# Patient Record
Sex: Female | Born: 2005 | Race: Black or African American | Hispanic: No | Marital: Single | Smoking: Never smoker
Health system: Southern US, Community
[De-identification: ages and names within clinical notes are randomized; demographics above are authoritative.]

## PROBLEM LIST (undated history)

## (undated) ENCOUNTER — Emergency Department (HOSPITAL_BASED_OUTPATIENT_CLINIC_OR_DEPARTMENT_OTHER): Admission: EM | Discharge status: Absent without leave | Payer: Self-pay

---
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
## 2023-05-21 ENCOUNTER — Ambulatory Visit: Payer: BC Managed Care – PPO

## 2023-05-21 ENCOUNTER — Other Ambulatory Visit (HOSPITAL_COMMUNITY)
Admission: RE | Admit: 2023-05-21 | Discharge: 2023-05-21 | Disposition: A | Discharge status: Home / Self Care | Payer: BC Managed Care – PPO | Source: Ambulatory Visit | Attending: Obstetrics and Gynecology | Admitting: Obstetrics and Gynecology

## 2023-05-21 VITALS — BP 107/68 | HR 61 | Wt 171.4 lb

## 2023-05-21 DIAGNOSIS — Z113 Encounter for screening for infections with a predominantly sexual mode of transmission: Secondary | ICD-10-CM | POA: Diagnosis not present

## 2023-05-21 DIAGNOSIS — N898 Other specified noninflammatory disorders of vagina: Secondary | ICD-10-CM | POA: Insufficient documentation

## 2023-05-21 DIAGNOSIS — R8290 Unspecified abnormal findings in urine: Secondary | ICD-10-CM | POA: Diagnosis not present

## 2023-05-21 DIAGNOSIS — B3731 Acute candidiasis of vulva and vagina: Secondary | ICD-10-CM | POA: Diagnosis not present

## 2023-05-21 LAB — POCT URINALYSIS DIPSTICK
Bilirubin, UA: NEGATIVE
Blood, UA: NEGATIVE
Glucose, UA: NEGATIVE
Ketones, UA: NEGATIVE
Nitrite, UA: NEGATIVE
Protein, UA: NEGATIVE
Spec Grav, UA: 1.025 (ref 1.010–1.025)
Urobilinogen, UA: 0.2 U/dL
pH, UA: 5 (ref 5.0–8.0)

## 2023-05-21 NOTE — Progress Notes (Signed)
..  SUBJECTIVE:  17 y.o. female complains of white vaginal discharge and burning for 5 day(s). Denies abnormal vaginal bleeding or significant pelvic pain or fever. Reports cloudy urine. Denies history of known exposure to STD.  LMP 04/25/23  OBJECTIVE:  She appears well, afebrile. Urine dipstick: positive for leukocytes.  ASSESSMENT:  Vaginal Discharge/burning  Cloudy urine   PLAN:  GC, chlamydia, trichomonas, BVAG, CVAG probe and urine culture sent to lab. Treatment: To be determined once lab results are received ROV prn if symptoms persist or worsen.

## 2023-05-22 LAB — CERVICOVAGINAL ANCILLARY ONLY
Bacterial Vaginitis (gardnerella): NEGATIVE
Candida Glabrata: NEGATIVE
Candida Vaginitis: POSITIVE — AB
Chlamydia: NEGATIVE
Comment: NEGATIVE
Comment: NEGATIVE
Comment: NEGATIVE
Comment: NEGATIVE
Comment: NEGATIVE
Comment: NORMAL
Neisseria Gonorrhea: NEGATIVE
Trichomonas: NEGATIVE

## 2023-05-23 LAB — URINE CULTURE

## 2023-05-24 MED ORDER — FLUCONAZOLE 150 MG PO TABS: 150.0000 mg | ORAL_TABLET | ORAL | 0 refills | Status: DC

## 2023-05-24 NOTE — Addendum Note (Signed)
Addended by: Harvie Bridge on: 05/24/2023 01:10 PM   Modules accepted: Orders

## 2023-09-29 ENCOUNTER — Ambulatory Visit: Payer: 59 | Admitting: Obstetrics & Gynecology

## 2023-10-08 ENCOUNTER — Encounter: Payer: Self-pay | Admitting: Obstetrics and Gynecology

## 2023-10-08 ENCOUNTER — Ambulatory Visit: Payer: 59 | Admitting: Obstetrics and Gynecology

## 2023-10-08 VITALS — BP 121/77 | HR 74 | Ht 61.0 in | Wt 178.0 lb

## 2023-10-08 DIAGNOSIS — Z975 Presence of (intrauterine) contraceptive device: Secondary | ICD-10-CM | POA: Diagnosis not present

## 2023-10-08 DIAGNOSIS — Z30431 Encounter for routine checking of intrauterine contraceptive device: Secondary | ICD-10-CM

## 2023-10-08 NOTE — Progress Notes (Signed)
   GYNECOLOGY PROGRESS NOTE  History:  18 y.o. G0P0000 presents to [facility] for IUD check. Paragard placed 09/2022. Doing well with IUD in place. Had occurrence where she was using the bathroom and straining and saw the strings. She has had some spotting after cycle finishes. Symptoms have not continued.   The following portions of the patient's history were reviewed and updated as appropriate: allergies, current medications, past family history, past medical history, past social history, past surgical history and problem list. Last pap smear on < 21 y.o.  Health Maintenance Due  Topic Date Due   Pneumococcal Vaccine 19-82 Years old (1 of 1 - PPSV23 or PCV20) 02/08/2010   HIV Screening  Never done   Hepatitis C Screening  Never done   INFLUENZA VACCINE  03/05/2023   COVID-19 Vaccine (3 - 2024-25 season) 04/05/2023     Review of Systems:  Pertinent items are noted in HPI.   Objective:  Physical Exam There were no vitals taken for this visit. VS reviewed, nursing note reviewed,  Constitutional: well developed, well nourished, no distress Pulm/chest wall: normal effort Breast Exam: deferred Abdomen: soft Neuro: alert and oriented  Skin: warm, dry Psych: affect normal Pelvic exam: Cervix pink, visually closed, without lesion, scant white creamy discharge, vaginal walls and external genitalia normal IUD strings in place, 2cm     Assessment & Plan:  1. IUD check up (Primary) IUD in place, strings appropriate. Discussed if symptoms occur more frequently or bleeding and cramping increase, can assess on ultrasound or remove/replace   Return in one year for annual or sooner if needed   Albertine Grates, FNP

## 2023-10-08 NOTE — Progress Notes (Signed)
 IUD strings were seen on several occasions. None now. No other problems today.

## 2023-11-04 ENCOUNTER — Other Ambulatory Visit (HOSPITAL_COMMUNITY)
Admission: RE | Admit: 2023-11-04 | Discharge: 2023-11-04 | Disposition: A | Discharge status: Home / Self Care | Source: Ambulatory Visit | Attending: Obstetrics and Gynecology | Admitting: Obstetrics and Gynecology

## 2023-11-04 ENCOUNTER — Ambulatory Visit (INDEPENDENT_AMBULATORY_CARE_PROVIDER_SITE_OTHER)

## 2023-11-04 VITALS — BP 117/83 | HR 79

## 2023-11-04 DIAGNOSIS — N898 Other specified noninflammatory disorders of vagina: Secondary | ICD-10-CM | POA: Insufficient documentation

## 2023-11-04 NOTE — Progress Notes (Signed)
 SUBJECTIVE:  18 y.o. female complains of vaginal itching, irritation for 2 days. Feels as though it may be another yeast infection.  Denies abnormal vaginal bleeding or significant pelvic pain or fever. No UTI symptoms. Denies history of known exposure to STD.  Patient's last menstrual period was 09/17/2023.  OBJECTIVE:  She appears well, afebrile. Urine dipstick: not done.  ASSESSMENT:  Vaginal Discharge  Vaginal Itching   PLAN:  GC, chlamydia, trichomonas, BVAG, CVAG probe sent to lab. Treatment: To be determined once lab results are received ROV prn if symptoms persist or worsen.

## 2023-11-05 ENCOUNTER — Other Ambulatory Visit: Payer: Self-pay

## 2023-11-05 DIAGNOSIS — B9689 Other specified bacterial agents as the cause of diseases classified elsewhere: Secondary | ICD-10-CM

## 2023-11-05 LAB — CERVICOVAGINAL ANCILLARY ONLY
Bacterial Vaginitis (gardnerella): POSITIVE — AB
Candida Glabrata: NEGATIVE
Candida Vaginitis: POSITIVE — AB
Chlamydia: NEGATIVE
Comment: NEGATIVE
Comment: NEGATIVE
Comment: NEGATIVE
Comment: NEGATIVE
Comment: NEGATIVE
Comment: NORMAL
Neisseria Gonorrhea: NEGATIVE
Trichomonas: NEGATIVE

## 2023-11-05 MED ORDER — FLUCONAZOLE 150 MG PO TABS: 150.0000 mg | ORAL_TABLET | ORAL | 0 refills | Status: DC

## 2023-11-05 MED ORDER — METRONIDAZOLE 500 MG PO TABS: 500.0000 mg | ORAL_TABLET | Freq: Two times a day (BID) | ORAL | 0 refills | Status: DC

## 2023-11-05 NOTE — Progress Notes (Signed)
Rx sent for BV and yeast per protocol .

## 2023-12-29 ENCOUNTER — Telehealth: Payer: Self-pay

## 2023-12-29 ENCOUNTER — Other Ambulatory Visit: Payer: Self-pay

## 2023-12-29 DIAGNOSIS — B379 Candidiasis, unspecified: Secondary | ICD-10-CM

## 2023-12-29 MED ORDER — FLUCONAZOLE 150 MG PO TABS: 150.0000 mg | ORAL_TABLET | Freq: Once | ORAL | 0 refills | Status: AC

## 2023-12-29 NOTE — Telephone Encounter (Signed)
 Call placed to patient. Advised that a dose of diflucan  will be sent to pharmacy. RN advised that patient may try OTC boric acid for reoccurring yeast infections. If no relief, we'd recommend her return for an office visit. Pt verbalized understanding.

## 2024-05-12 ENCOUNTER — Other Ambulatory Visit: Payer: Self-pay

## 2024-05-12 ENCOUNTER — Emergency Department (HOSPITAL_BASED_OUTPATIENT_CLINIC_OR_DEPARTMENT_OTHER)
Admission: EM | Admit: 2024-05-12 | Discharge: 2024-05-13 | Disposition: A | Discharge status: Home / Self Care | Attending: Emergency Medicine | Admitting: Emergency Medicine

## 2024-05-12 ENCOUNTER — Encounter (HOSPITAL_BASED_OUTPATIENT_CLINIC_OR_DEPARTMENT_OTHER): Payer: Self-pay | Admitting: Emergency Medicine

## 2024-05-12 DIAGNOSIS — S060X0A Concussion without loss of consciousness, initial encounter: Secondary | ICD-10-CM | POA: Diagnosis not present

## 2024-05-12 DIAGNOSIS — S0990XA Unspecified injury of head, initial encounter: Secondary | ICD-10-CM | POA: Diagnosis present

## 2024-05-12 DIAGNOSIS — S0083XA Contusion of other part of head, initial encounter: Secondary | ICD-10-CM | POA: Diagnosis not present
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
## 2024-05-13 ENCOUNTER — Emergency Department (HOSPITAL_BASED_OUTPATIENT_CLINIC_OR_DEPARTMENT_OTHER)

## 2024-05-13 MED ORDER — ACETAMINOPHEN 325 MG PO TABS
650.0000 mg | ORAL_TABLET | Freq: Once | ORAL | Status: AC
  Administered 2024-05-13: 650 mg via ORAL
  Filled 2024-05-13: qty 2
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
## 2024-05-13 NOTE — ED Notes (Signed)
 Wound cleaned.
